# Patient Record
Sex: Male | Born: 2000 | Race: Black or African American | Hispanic: No | Marital: Single | Smoking: Former smoker
Health system: Southern US, Community
[De-identification: ages and names within clinical notes are randomized; demographics above are authoritative.]

## PROBLEM LIST (undated history)

## (undated) DIAGNOSIS — J45909 Unspecified asthma, uncomplicated: Secondary | ICD-10-CM

## (undated) DIAGNOSIS — T07XXXA Unspecified multiple injuries, initial encounter: Secondary | ICD-10-CM

## (undated) HISTORY — DX: Unspecified multiple injuries, initial encounter: T07.XXXA

---
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
## 2020-08-15 ENCOUNTER — Emergency Department (HOSPITAL_COMMUNITY)
Admission: EM | Admit: 2020-08-15 | Discharge: 2020-08-15 | Disposition: A | Discharge status: Home / Self Care | Attending: Emergency Medicine | Admitting: Emergency Medicine

## 2020-08-15 ENCOUNTER — Encounter (HOSPITAL_COMMUNITY): Payer: Self-pay | Admitting: Emergency Medicine

## 2020-08-15 ENCOUNTER — Other Ambulatory Visit: Payer: Self-pay

## 2020-08-15 DIAGNOSIS — Y92143 Cell of prison as the place of occurrence of the external cause: Secondary | ICD-10-CM | POA: Diagnosis not present

## 2020-08-15 DIAGNOSIS — W06XXXA Fall from bed, initial encounter: Secondary | ICD-10-CM | POA: Insufficient documentation

## 2020-08-15 DIAGNOSIS — S52135A Nondisplaced fracture of neck of left radius, initial encounter for closed fracture: Secondary | ICD-10-CM | POA: Diagnosis present

## 2020-08-15 DIAGNOSIS — J45909 Unspecified asthma, uncomplicated: Secondary | ICD-10-CM | POA: Diagnosis not present

## 2020-08-15 DIAGNOSIS — Z87891 Personal history of nicotine dependence: Secondary | ICD-10-CM | POA: Insufficient documentation

## 2020-08-15 HISTORY — DX: Unspecified asthma, uncomplicated: J45.909

## 2020-08-15 NOTE — Discharge Instructions (Addendum)
May take Tylenol and ibuprofen as needed for pain.  Ice to the area.  Follow-up with orthopedics within the week for reevaluation

## 2020-08-15 NOTE — ED Provider Notes (Signed)
[facility] EMERGENCY DEPARTMENT Provider Note   CSN: [id number] Arrival date & time: 08/15/20  1123     History Chief Complaint  Patient presents with  . Arm Pain    Christian Cohen is a 19 y.o. male with past medical history significant for asthma who presents for evaluation of arm pain.  Patient rolled off a bunk while incarcerated.  Had proximal radial head pain.  Had imaging performed yesterday evening.  They were called today states that patient had nondisplaced left radial neck fracture.  Officers presented today to have splint applied to the can follow-up outpatient.  Patient denies hitting head, LOC or any coagulation.  He has been taking Tylenol which has helped with his pain.  He denies any redness, swelling, warmth, paresthesias, weakness chest pain, decreased range of motion.  Denies additional aggravating or relieving factors.  History obtained from patient and past medical records. No interpreter used.  HPI     Past Medical History:  Diagnosis Date  . Asthma     There are no problems to display for this patient.   History reviewed. No pertinent surgical history.     History reviewed. No pertinent family history.  Social History   Tobacco Use  . Smoking status: Former Smoker    Packs/day: 0.50    Years: 4.00    Pack years: 2.00    Types: Cigarettes    Quit date: 08/16/2019    Years since quitting: 1.0  . Smokeless tobacco: Never Used  Vaping Use  . Vaping Use: Never used  Substance Use Topics  . Alcohol use: No  . Drug use: Not on file    Home Medications Prior to Admission medications   Medication Sig Start Date End Date Taking? Authorizing Provider  predniSONE (DELTASONE) 20 MG tablet Take 2 tablets (40 mg total) by mouth daily. 02/17/16   Jennye Moccasin, MD    Allergies    Patient has no known allergies.  Review of Systems   Review of Systems  Constitutional: Negative.   HENT: Negative.   Respiratory: Negative.   Cardiovascular:  Negative.   Gastrointestinal: Negative.   Genitourinary: Negative.   Musculoskeletal: Negative for arthralgias, back pain, gait problem, joint swelling, myalgias, neck pain and neck stiffness.       Left proximal forearm pain  Skin: Negative.   Neurological: Negative.   All other systems reviewed and are negative.   Physical Exam Updated Vital Signs BP (!) 117/94 (BP Location: Right Arm)   Pulse 62   Temp 98.3 F (36.8 C) (Oral)   Resp 18   Wt 63.5 kg   SpO2 100%   Physical Exam Vitals and nursing note reviewed.  Constitutional:      General: He is not in acute distress.    Appearance: He is well-developed. He is not ill-appearing, toxic-appearing or diaphoretic.  HENT:     Head: Normocephalic and atraumatic. No raccoon eyes, Battle's sign, abrasion or contusion.     Jaw: There is normal jaw occlusion.  Eyes:     Extraocular Movements: Extraocular movements intact.     Pupils: Pupils are equal, round, and reactive to light.  Neck:     Trachea: Phonation normal.  Cardiovascular:     Rate and Rhythm: Normal rate and regular rhythm.     Pulses: Normal pulses.          Radial pulses are 2+ on the right side and 2+ on the left side.     Heart  sounds: Normal heart sounds.  Pulmonary:     Effort: Pulmonary effort is normal. No respiratory distress.     Breath sounds: Normal breath sounds and air entry.  Chest:     Comments: Equal rise and fall to chest wall.  No chest wall tenderness to anterior posteriorly.  No crepitus or step-offs Abdominal:     General: Bowel sounds are normal. There is no distension.     Palpations: Abdomen is soft.     Tenderness: There is no abdominal tenderness.     Hernia: No hernia is present.  Musculoskeletal:        General: Normal range of motion.     Right shoulder: Normal.     Left shoulder: Normal.     Right upper arm: Normal.     Left upper arm: Normal.     Right elbow: Normal.     Left elbow: No swelling, deformity, effusion or  lacerations. Normal range of motion. Tenderness present in radial head.     Right forearm: Normal.     Left forearm: Normal.     Right wrist: Normal.     Left wrist: Normal.     Cervical back: Full passive range of motion without pain, normal range of motion and neck supple. No signs of trauma or rigidity. No pain with movement, spinous process tenderness or muscular tenderness. Normal range of motion.     Comments: Tenderness at left radial head.  Able to pronate, supinate, flex and extend.  Range of motion bilateral shoulders, hands.  No midline spinal tenderness, step-off  Skin:    General: Skin is warm and dry.     Capillary Refill: Capillary refill takes less than 2 seconds.     Comments: No edema, erythema or warmth.  No fluctuance or induration.  Neurological:     General: No focal deficit present.     Mental Status: He is alert.     Cranial Nerves: Cranial nerves are intact.     Sensory: Sensation is intact.     Motor: Motor function is intact.     Comments: Equal handgrip bilaterally Intact sensation Ambulatory but difficulty     ED Results / Procedures / Treatments   Labs (all labs ordered are listed, but only abnormal results are displayed) Labs Reviewed - No data to display  EKG None  Radiology No results found.     Procedures .Splint Application  Date/Time: 08/15/2020 2:21 PM Performed by: Linwood Dibbles, PA-C Authorized by: Linwood Dibbles, PA-C   Consent:    Consent obtained:  Verbal   Consent given by:  Patient   Risks discussed:  Numbness, pain, swelling and discoloration   Alternatives discussed:  Referral, observation, alternative treatment, delayed treatment and no treatment Pre-procedure details:    Sensation:  Normal Procedure details:    Laterality:  Left   Location:  Arm   Arm:  L lower arm   Strapping: no     Cast type:  Long arm   Splint type:  Long arm   Supplies:  Aluminum splint, sling, Ortho-Glass and cotton padding  Post-procedure details:    Pain:  Unchanged   Sensation:  Normal   Patient tolerance of procedure:  Tolerated well, no immediate complications   (including critical care time)  Medications Ordered in ED Medications - No data to display  ED Course  I have reviewed the triage vital signs and the nursing notes.  Pertinent labs & imaging results that were available during  my care of the patient were reviewed by me and considered in my medical decision making (see chart for details).  19 year old male presents for evaluation of radial head fracture.  Had a fall off bunk [facility] in jail.  Had imaging performed yesterday evening.  He returned today due to call from imaging center that patient had nondisplaced radial head/neck fracture.  Patient is neurovascularly intact.  He has some mild tenderness to his radial head however full range of motion without difficulty.  He has no evidence of acute intracranial, thoracic or abdominal traumatic injuries.  See picture in chart for imaging read.  Patient does not want repeat imaging at this time which I feel is reasonable.  Patient placed in splint, sling.  We will have him follow-up with orthopedics.  Continues to be neurovascularly intact after splint placement.  The patient has been appropriately medically screened and/or stabilized in the ED. I have low suspicion for any other emergent medical condition which would require further screening, evaluation or treatment in the ED or require inpatient management.  Patient is hemodynamically stable and in no acute distress.  Patient able to ambulate in department prior to ED.  Evaluation does not show acute pathology that would require ongoing or additional emergent interventions while in the emergency department or further inpatient treatment.  I have discussed the diagnosis with the patient and answered all questions.  Pain is been managed while in the emergency department and patient has no further  complaints prior to discharge.  Patient is comfortable with plan discussed in room and is stable for discharge at this time.  I have discussed strict return precautions for returning to the emergency department.  Patient was encouraged to follow-up with PCP/specialist refer to at discharge.    MDM Rules/Calculators/A&P                           Final Clinical Impression(s) / ED Diagnoses Final diagnoses:  Closed nondisplaced fracture of neck of left radius, initial encounter    Rx / DC Orders ED Discharge Orders    None       Henderly, Britni A, PA-C 08/15/20 1805    Bethann Berkshire, MD 08/16/20 [phone]

## 2020-08-15 NOTE — ED Triage Notes (Signed)
Pt is here from the [place] [facility] accompanied by an Nutritional therapist.   Pt has a nondisplaced left radial neck fracture x3 days.  Pt is here for a splint. Officers has documentation of the xray report.

## 2020-08-26 ENCOUNTER — Ambulatory Visit (INDEPENDENT_AMBULATORY_CARE_PROVIDER_SITE_OTHER): Admitting: Orthopedic Surgery

## 2020-08-26 ENCOUNTER — Encounter: Payer: Self-pay | Admitting: Orthopedic Surgery

## 2020-08-26 ENCOUNTER — Other Ambulatory Visit: Payer: Self-pay | Admitting: Orthopedic Surgery

## 2020-08-26 ENCOUNTER — Ambulatory Visit

## 2020-08-26 ENCOUNTER — Other Ambulatory Visit: Payer: Self-pay

## 2020-08-26 VITALS — BP 118/76 | HR 73 | Ht 64.0 in | Wt 150.1 lb

## 2020-08-26 DIAGNOSIS — M25522 Pain in left elbow: Secondary | ICD-10-CM | POA: Diagnosis not present

## 2020-08-26 DIAGNOSIS — M25512 Pain in left shoulder: Secondary | ICD-10-CM

## 2020-08-26 NOTE — Progress Notes (Signed)
NEW PROBLEM//OFFICE VISIT  Chief Complaint  Patient presents with  . Shoulder Problem    L/ DOI 08/11/20 fell [facility] the bunk in jail/went to [facility] ER    19 year old male inmate fell out of his bunk on December 1 injured his left elbow complains of left elbow pain.  Seen in the ER placed in a splint comes in still complaining of mild discomfort lateral elbow   Review of Systems  All other systems reviewed and are negative.    Past Medical History:  Diagnosis Date  . Asthma   . Fractures     No past surgical history on file.  Family History  Family history unknown: Yes   Social History   Tobacco Use  . Smoking status: Former Smoker    Packs/day: 0.50    Years: 4.00    Pack years: 2.00    Types: Cigarettes    Quit date: 08/16/2019    Years since quitting: 1.0  . Smokeless tobacco: Never Used  Vaping Use  . Vaping Use: Never used  Substance Use Topics  . Alcohol use: No    No Known Allergies  No outpatient medications have been marked as taking for the 08/26/20 encounter (Office Visit) with Vickki Hearing, MD.    BP 118/76   Pulse 73   Ht 5\' 4"  (1.626 m)   Wt 150 lb 2 oz (68.1 kg)   BMI 25.77 kg/m   Physical Exam General appearance normal ectomorphic body habitus oriented x3 normal mood affect normal   Ortho Exam  Gait normal noncontributory  Injured extremity Elbow left  Tenderness lateral radial head and elbow has full range of motion no instability normal motor exam skins intact sensation and pulse are normal Range of motion  MEDICAL DECISION MAKING  A.  Encounter Diagnosis  Name Primary?  . Acute pain of left shoulder Yes    B. DATA ANALYSED:   IMAGING: Interpretation of images: Outside report is available for review it says he has a radial neck fracture  I had to order new x-rays which confirmed he has a nondisplaced radial neck fracture   Orders: Active range of motion self-directed  Outside records reviewed: ER record   C.  MANAGEMENT   X-ray in 4 weeks no lifting active range of motion allowed no splint needed  No orders of the defined types were placed in this encounter.     , MD  08/26/2020 10:29 AM

## 2020-08-26 NOTE — Patient Instructions (Addendum)
3-4 times a day bend the elbow 30-40 times  No splint Radial Head Fracture  A radial head fracture is a break in the smaller bone in your forearm (radius) near the end of the bone at the elbow joint. There are two bones in your forearm. The radius, or radial bone, is the bone on the side of your thumb. There are different types of radial head fractures. The type is determined by the amount of movement (displacement) of bones from their normal positions:  Type 1. This is a small fracture in which the bone pieces remain together (nondisplaced fracture).  Type 2. The fracture is moderate, and bone pieces are slightly displaced.  Type 3. There are multiple fractures and displaced bone pieces. What are the causes? This condition is usually caused by falling and landing on an outstretched arm. What increases the risk? You are more likely to develop this condition if you:  Are male.  Are 34-20 years old.  Have weak bones or osteoporosis. What are the signs or symptoms? Symptoms of this condition include:  Swelling of the elbow joint.  Pain and difficulty when moving the elbow joint. How is this diagnosed? This condition is diagnosed based on your medical history and a physical exam. You may have X-rays to confirm the type of fracture. How is this treated? Treatment for this condition includes resting, icing, and raising (elevating) the injured area above the level of your heart. You may be given medicines to help relieve pain. Treatment varies depending on the type of fracture you have.  For a type 1 fracture, you may be given a splint or sling to keep your arm and elbow from moving for several days.  For a type 2 fracture, you may be given a splint or sling to keep your arm and elbow from moving for several days. If the displacement is more severe, you may need surgery.  For a type 3 fracture, you will usually need surgery to have bone pieces removed. The entire radial head may  need to be removed if the damage is severe. Follow these instructions at home: Medicines  Take over-the-counter and prescription medicines only as told by your health care provider.  Ask your health care provider if the medicine prescribed to you: ? Requires you to avoid driving or using heavy machinery. ? Can cause constipation. You may need to take these actions to prevent or treat constipation:  Drink enough fluid to keep your urine pale yellow.  Take over-the-counter or prescription medicines.  Eat foods that are high in fiber, such as beans, whole grains, and fresh fruits and vegetables.  Limit foods that are high in fat and processed sugars, such as fried or sweet foods. If you have a splint or sling:  Wear the splint or sling as told by your health care provider. Remove it only as told by your health care provider.  Loosen it if your fingers tingle, become numb, or turn cold and blue.  Keep it clean and dry. If you have a splint:  Do not put pressure on any part of the splint until it is fully hardened. This may take several hours.  Check the skin around it every day. Tell your health care provider about any concerns. Bathing  Do not take baths, swim, or use a hot tub until your health care provider approves. Ask your health care provider if you may take showers. You may only be allowed to take sponge baths.  If the splint  or sling is not waterproof: ? Do not let it get wet. ? Cover it with a watertight covering when you take a bath or shower. Managing pain, stiffness, and swelling   If directed, put ice on the injured area. ? If you have a removable splint or sling, remove it as told by your health care provider. ? Put ice in a plastic bag. ? Place a towel between your skin and the bag. ? Leave the ice on for 20 minutes, 2-3 times a day.  Move your fingers often to reduce stiffness and swelling.  Raise (elevate) the injured area above the level of your heart  while you are sitting or lying down. Activity  Ask your health care provider when it is safe to drive if you have a splint or sling on your arm.  Do not lift anything that is heavier than 10 lb (4.5 kg), or the limit that you are told, until your health care provider says that it is safe.  Return to your normal activities as told by your health care provider. Ask your health care provider what activities are safe for you.  Do exercises as told by your health care provider or physical therapist. General instructions  Do not use any products that contain nicotine or tobacco, such as cigarettes, e-cigarettes, and chewing tobacco. These can delay bone healing. If you need help quitting, ask your health care provider.  Keep all follow-up visits as told by your health care provider. This is important. Contact a health care provider if:  You have problems with your splint.  You have pain or swelling that gets worse. Get help right away if:  You have severe pain.  You have fluid or a bad smell coming from your splint.  Your hand or fingers get cold or turn pale or blue.  You lose feeling in any part of your hand or arm. Summary  A radial head fracture is a break in the smaller bone in your forearm (radius) near the end of the bone at the elbow joint.  This condition usually happens because of an injury, such as falling on an outstretched arm.  This condition may be treated with rest, ice, elevation, pain medicines, a splint or sling, and surgery. Treatment will vary depending on the type of fracture you have. This information is not intended to replace advice given to you by your health care provider. Make sure you discuss any questions you have with your health care provider. Document Revised: 09/05/2018 Document Reviewed: 09/05/2018 Elsevier Patient Education  2020 [employer].

## 2020-09-23 ENCOUNTER — Ambulatory Visit (INDEPENDENT_AMBULATORY_CARE_PROVIDER_SITE_OTHER): Payer: Medicaid Other | Admitting: Orthopedic Surgery

## 2020-09-23 ENCOUNTER — Encounter: Payer: Self-pay | Admitting: Orthopedic Surgery

## 2020-09-23 ENCOUNTER — Other Ambulatory Visit: Payer: Self-pay

## 2020-09-23 ENCOUNTER — Ambulatory Visit: Payer: Medicaid Other

## 2020-09-23 VITALS — BP 112/75 | HR 64 | Ht 64.0 in | Wt 150.0 lb

## 2020-09-23 DIAGNOSIS — S52125D Nondisplaced fracture of head of left radius, subsequent encounter for closed fracture with routine healing: Secondary | ICD-10-CM

## 2020-09-23 DIAGNOSIS — M25522 Pain in left elbow: Secondary | ICD-10-CM

## 2020-09-23 NOTE — Progress Notes (Signed)
Fracture care follow-up  Encounter Diagnoses  Name Primary?  . Pain in left elbow Yes  . Closed nondisplaced fracture of head of left radius with routine healing, subsequent encounter      Initial office visit August 26, 2020  Date of injury August 11, 2020  Left elbow fracture radial neck fracture  Treated with active range of motion exercises  DATA: HE SAYS he is having no problems with his elbow Physical Exam Musculoskeletal:     Left elbow: No swelling, deformity, effusion or lacerations. Normal range of motion. No tenderness.  Skin:    General: Skin is warm.     Capillary Refill: Capillary refill takes less than 2 seconds.  Neurological:     General: No focal deficit present.     Mental Status: He is alert and oriented to person, place, and time.     Motor: No weakness, atrophy or abnormal muscle tone.  Psychiatric:        Mood and Affect: Mood normal.        Behavior: Behavior normal.    I released him today after his x-ray showed fracture healing and his exam was normal

## 2020-09-23 NOTE — Patient Instructions (Signed)
You can resume normal activity
